# Patient Record
Sex: Female | Born: 1960 | Race: White | Hispanic: No | Marital: Married | Smoking: Never smoker
Health system: Southern US, Community
[De-identification: ages and names within clinical notes are randomized; demographics above are authoritative.]

## PROBLEM LIST (undated history)

## (undated) DIAGNOSIS — E785 Hyperlipidemia, unspecified: Secondary | ICD-10-CM

## (undated) DIAGNOSIS — I10 Essential (primary) hypertension: Secondary | ICD-10-CM

## (undated) DIAGNOSIS — R030 Elevated blood-pressure reading, without diagnosis of hypertension: Secondary | ICD-10-CM

## (undated) DIAGNOSIS — Z8679 Personal history of other diseases of the circulatory system: Secondary | ICD-10-CM

## (undated) DIAGNOSIS — E0590 Thyrotoxicosis, unspecified without thyrotoxic crisis or storm: Secondary | ICD-10-CM

## (undated) HISTORY — DX: Personal history of other diseases of the circulatory system: Z86.79

## (undated) HISTORY — DX: Essential (primary) hypertension: I10

## (undated) HISTORY — DX: Elevated blood-pressure reading, without diagnosis of hypertension: R03.0

## (undated) HISTORY — PX: APPENDECTOMY: SHX54

## (undated) HISTORY — DX: Thyrotoxicosis, unspecified without thyrotoxic crisis or storm: E05.90

## (undated) HISTORY — DX: Hyperlipidemia, unspecified: E78.5

---

## 1997-09-22 ENCOUNTER — Other Ambulatory Visit: Admission: RE | Admit: 1997-09-22 | Discharge: 1997-09-22 | Payer: Self-pay | Admitting: Gastroenterology

## 1998-04-18 ENCOUNTER — Other Ambulatory Visit: Admission: RE | Admit: 1998-04-18 | Discharge: 1998-04-18 | Payer: Self-pay | Admitting: Obstetrics and Gynecology

## 1999-06-19 ENCOUNTER — Other Ambulatory Visit: Admission: RE | Admit: 1999-06-19 | Discharge: 1999-06-19 | Payer: Self-pay | Admitting: Obstetrics and Gynecology

## 2000-03-12 ENCOUNTER — Encounter: Admission: RE | Admit: 2000-03-12 | Discharge: 2000-03-12 | Payer: Self-pay | Admitting: Obstetrics and Gynecology

## 2000-03-12 ENCOUNTER — Encounter: Payer: Self-pay | Admitting: Obstetrics and Gynecology

## 2000-03-22 ENCOUNTER — Encounter: Payer: Self-pay | Admitting: Neurological Surgery

## 2000-03-22 ENCOUNTER — Ambulatory Visit (HOSPITAL_COMMUNITY): Admission: RE | Admit: 2000-03-22 | Discharge: 2000-03-22 | Payer: Self-pay | Admitting: Neurological Surgery

## 2000-08-18 ENCOUNTER — Other Ambulatory Visit: Admission: RE | Admit: 2000-08-18 | Discharge: 2000-08-18 | Payer: Self-pay | Admitting: Obstetrics and Gynecology

## 2000-08-29 ENCOUNTER — Encounter: Admission: RE | Admit: 2000-08-29 | Discharge: 2000-11-27 | Payer: Self-pay | Admitting: Neurological Surgery

## 2000-10-03 ENCOUNTER — Ambulatory Visit (HOSPITAL_COMMUNITY): Admission: RE | Admit: 2000-10-03 | Discharge: 2000-10-03 | Payer: Self-pay | Admitting: Neurological Surgery

## 2000-10-03 ENCOUNTER — Encounter: Payer: Self-pay | Admitting: Neurological Surgery

## 2001-03-17 ENCOUNTER — Encounter: Payer: Self-pay | Admitting: Obstetrics and Gynecology

## 2001-03-17 ENCOUNTER — Encounter: Admission: RE | Admit: 2001-03-17 | Discharge: 2001-03-17 | Payer: Self-pay | Admitting: Obstetrics and Gynecology

## 2001-09-21 ENCOUNTER — Other Ambulatory Visit: Admission: RE | Admit: 2001-09-21 | Discharge: 2001-09-21 | Payer: Self-pay | Admitting: Obstetrics and Gynecology

## 2004-01-25 ENCOUNTER — Other Ambulatory Visit: Admission: RE | Admit: 2004-01-25 | Discharge: 2004-01-25 | Payer: Self-pay | Admitting: Obstetrics and Gynecology

## 2005-02-27 ENCOUNTER — Other Ambulatory Visit: Admission: RE | Admit: 2005-02-27 | Discharge: 2005-02-27 | Payer: Self-pay | Admitting: Obstetrics and Gynecology

## 2009-05-23 ENCOUNTER — Encounter: Admission: RE | Admit: 2009-05-23 | Discharge: 2009-05-23 | Payer: Self-pay | Admitting: Obstetrics and Gynecology

## 2009-06-22 DIAGNOSIS — R030 Elevated blood-pressure reading, without diagnosis of hypertension: Secondary | ICD-10-CM

## 2009-06-22 DIAGNOSIS — E785 Hyperlipidemia, unspecified: Secondary | ICD-10-CM | POA: Insufficient documentation

## 2009-06-27 ENCOUNTER — Ambulatory Visit: Payer: Self-pay | Admitting: Cardiology

## 2009-06-27 DIAGNOSIS — I10 Essential (primary) hypertension: Secondary | ICD-10-CM | POA: Insufficient documentation

## 2009-06-27 DIAGNOSIS — Z8679 Personal history of other diseases of the circulatory system: Secondary | ICD-10-CM | POA: Insufficient documentation

## 2009-08-04 ENCOUNTER — Ambulatory Visit (HOSPITAL_COMMUNITY): Admission: RE | Admit: 2009-08-04 | Discharge: 2009-08-04 | Payer: Self-pay | Admitting: Cardiology

## 2009-08-04 ENCOUNTER — Ambulatory Visit: Payer: Self-pay

## 2009-08-04 ENCOUNTER — Ambulatory Visit: Payer: Self-pay | Admitting: Cardiology

## 2009-08-04 ENCOUNTER — Encounter: Payer: Self-pay | Admitting: Cardiology

## 2009-08-04 ENCOUNTER — Ambulatory Visit: Payer: Self-pay | Admitting: Cardiovascular Disease

## 2009-08-08 ENCOUNTER — Telehealth: Payer: Self-pay | Admitting: Cardiology

## 2009-08-08 LAB — CONVERTED CEMR LAB
ALT: 21 units/L (ref 0–35)
AST: 26 units/L (ref 0–37)
Albumin: 4.8 g/dL (ref 3.5–5.2)
Alkaline Phosphatase: 40 units/L (ref 39–117)
BUN: 9 mg/dL (ref 6–23)
Bilirubin, Direct: 0 mg/dL (ref 0.0–0.3)
CO2: 23 meq/L (ref 19–32)
Calcium: 9.8 mg/dL (ref 8.4–10.5)
Chloride: 107 meq/L (ref 96–112)
Creatinine, Ser: 0.8 mg/dL (ref 0.4–1.2)
GFR calc non Af Amer: 80.71 mL/min (ref >60)
Glucose, Bld: 87 mg/dL (ref 70–99)
Potassium: 4.1 meq/L (ref 3.5–5.1)
Sodium: 141 meq/L (ref 135–145)
Total Bilirubin: 0.4 mg/dL (ref 0.3–1.2)
Total Protein: 7.6 g/dL (ref 6.0–8.3)

## 2009-08-09 ENCOUNTER — Ambulatory Visit: Payer: Self-pay | Admitting: Cardiology

## 2010-05-27 ENCOUNTER — Encounter: Payer: Self-pay | Admitting: Obstetrics and Gynecology

## 2010-06-07 NOTE — Assessment & Plan Note (Signed)
Summary: 1 month rov   Visit Type:  1 mo f/u Referring Provider:  Richardean Chimera Primary Provider:  No PCP  CC:  no cardiac complaints today..pt is follwing a protein regimen that Dr. Arelia Sneddon has recommended in turn pt has lost 7 lb since 1 month.  History of Present Illness: Amanda Petty her times a day for hypertension and hyperlipidemia.  Her blood pressures are running consistently around 135/90 or less.  She is asymptomatic.  Echocardiogram showed no LVH which is reassuring for no uncontrolled hypertension for any degree of time. The rest of her echo is normal as well. I have reviewed this with her today.  She is extremely health-conscious and continues to exercise and watches her salt carefully. 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  Current Medications (verified): 1)  Venlafaxine Hcl 75 Mg Tabs (Venlafaxine Hcl) .... 1/2 Tab Once Daily 2)  Lisinopril-Hydrochlorothiazide 20-12.5 Mg Tabs (Lisinopril-Hydrochlorothiazide) .Marland Kitchen.. 1 Once Daily 3)  Calcium 500 Mg Tabs (Calcium) .Marland Kitchen.. 1 Tab Two Times A Day 4)  Multivitamins   Tabs (Multiple Vitamin) .Marland Kitchen.. 1 Tab Once Daily 5)  Ferrous Sulfate 325 (65 Fe) Mg Tabs (Ferrous Sulfate) .Marland Kitchen.. 1 Tab Two Times A Day  Allergies (verified): No Known Drug Allergies  Past History:  Past Surgical History: Last updated: 06/27/2009 Appendectomy  Review of Systems       negative other than history of present illness  Vital Signs:  Patient profile:   50 year old female Height:      62 inches Weight:      111 pounds BMI:     20.38 Pulse rate:   88 / minute Pulse rhythm:   regular BP sitting:   134 / 80  (left arm) Cuff size:   large  Vitals Entered By: Danielle Rankin, CMA (August 09, 2009 10:30 AM)  Physical Exam  General:  Well developed, well nourished, in no acute distress. Head:  normocephalic and atraumatic Eyes:  PERRLA/EOM intact; conjunctiva and lids normal. Neck:  Neck supple, no JVD. No masses, thyromegaly or abnormal cervical nodes. Chest Amanda Petty:   no deformities or breast masses noted Lungs:  Clear bilaterally to auscultation and percussion. Heart:  Non-displaced PMI, chest non-tender; regular rate and rhythm, S1, S2 without murmurs, rubs or gallops. Carotid upstroke normal, no bruit. Normal abdominal aortic size, no bruits. Femorals normal pulses, no bruits. Pedals normal pulses. No edema, no varicosities. Msk:  Back normal, normal gait. Muscle strength and tone normal. Pulses:  pulses normal in all 4 extremities Extremities:  No clubbing or cyanosis. Neurologic:  Alert and oriented x 3. Skin:  Intact without lesions or rashes. Psych:  Normal affect.   Impression & Recommendations:  Problem # 1:  HYPERTENSION, UNSPECIFIED (ICD-401.9) Assessment Improved I have reviewed her outside blood pressure log. She is currently averaging around 135 over about 90. In the office today her diastolic is good. We'll continue to monitor her resting blood pressures at home several times a week. Her goal is 130/80 and certainly below 140/85. She will call us if this is not consistently her read.  Her updated medication list for this problem includes:    Lisinopril-hydrochlorothiazide 20-12.5 Mg Tabs (Lisinopril-hydrochlorothiazide) .Marland Kitchen... 1 once daily  Problem # 2:  HYPERLIPIDEMIA (ICD-272.4) Assessment: Unchanged Once we have her liposcience profile, I will call her. Her electrolytes as well as her liver panel are normal and reviewed with her today.  Patient Instructions: 1)  Your physician recommends that you schedule a follow-up appointment in: 3  MONTHS WITH DR Little Winton 2)  Your physician recommends that you continue on your current medications as directed. Please refer to the Current Medication list given to you today. 3)  Your physician has requested that you regularly monitor and record your blood pressure readings at home.  Please use the same machine at the same time of day to check your readings and record them to bring to your follow-up  visit.CHECK B/P 3 X WEEKLY GOAL B/P IS 130/80 IF REPEATEDLY 140/85  OR HIGHER CALL OFFICE

## 2010-06-07 NOTE — Miscellaneous (Signed)
  Clinical Lists Changes  Orders: Added new Service order of EKG w/ Interpretation (93000) - Signed Added new Referral order of Echocardiogram (Echo) - Signed Observations: Added new observation of PI CARDIO: Your physician recommends that you schedule a follow-up appointment in: 4 WEEKS WITH DR Moorea Boissonneault Your physician recommends that you return for lab work ZO:XWRU DAY AS  ECHO BMET LIPOSCIENCE LIVER FASTING Your physician has requested that you have an echocardiogram.  Echocardiography is a painless test that uses sound waves to create images of your heart. It provides your doctor with information about the size and shape of your heart and how well your heart's chambers and valves are working.  This procedure takes approximately one hour. There are no restrictions for this procedure.LAB SAME DAY  SEE ABOVE Your physician has recommended you make the following change in your medication: LISINOPRIL/HCTZ 20/12.5 MG 1 EVERY AM (06/27/2009 16:15)      Patient Instructions: 1)  Your physician recommends that you schedule a follow-up appointment in: 4 WEEKS WITH DR Akira Perusse 2)  Your physician recommends that you return for lab work EA:VWUJ DAY AS  ECHO BMET LIPOSCIENCE LIVER FASTING 3)  Your physician has requested that you have an echocardiogram.  Echocardiography is a painless test that uses sound waves to create images of your heart. It provides your doctor with information about the size and shape of your heart and how well your heart's chambers and valves are working.  This procedure takes approximately one hour. There are no restrictions for this procedure.LAB SAME DAY  SEE ABOVE 4)  Your physician has recommended you make the following change in your medication: LISINOPRIL/HCTZ 20/12.5 MG 1 EVERY AM

## 2010-06-07 NOTE — Progress Notes (Signed)
Summary: PT RETURN CALL  Phone Note Call from Patient Call back at [phone]   Summary of Call: PT ERTURNING CALL Initial call taken by: Judie Grieve,  August 08, 2009 2:29 PM  Follow-up for Phone Call        Phone Call CompletedPT AWARE OF LAB RESULTS. Follow-up by: Scherrie Bateman, LPN,  August 08, 2009 2:37 PM

## 2010-06-07 NOTE — Assessment & Plan Note (Signed)
Summary: np6/elevated lipids & Bp/jml   Visit Type:  new pt visit Referring Provider:  Richardean Chimera Primary Provider:  No PCP  CC:  pt referred for hyperlipidemia and HTN....denies any cardiac complaints.  History of Present Illness: Amanda Petty comes in today for evaluation and management of hyperlipidemia and hypertension.  Hyperlipidemia runs in her family. Hypertension does as well.  She's extremely health-conscious. She is not awake, exercises 4-5 times a week, and is very particular about her diet including fat and salt restriction.  She denies any chest discomfort or angina. He's had no headaches, visual disturbances, orthopnea, PND, peripheral edema, or rash. She denies any tachycardia or palpitations.  Recent blood work including a thyroid panel were normal. I do not see electrolytes.  Her blood sugars normal. There is no history of tobacco use or excess alcohol use. She does not use illicit drugs.  Her total cholesterol was 308, LDL is 150, triglycerides 83, but her HDL is 141. Her total cholesterol HDL ratio is 2.2  Preventive Screening-Counseling & Management  Alcohol-Tobacco     Smoking Status: never  Caffeine-Diet-Exercise     Does Patient Exercise: yes      Drug Use:  no.    Current Medications (verified): 1)  Venlafaxine Hcl 75 Mg Tabs (Venlafaxine Hcl) .... 1/2 Tab Once Daily  Allergies (verified): No Known Drug Allergies  Past History:  Family History: Last updated: July 13, 2009 Father: deceased at 73 due to heart failure Mother: alive and well Siblings: alive and well  Social History: Last updated: 2009-07-13 Married  Tobacco Use - No.  Alcohol Use - yes Regular Exercise - yes Drug Use - no  Risk Factors: Exercise: yes (13-Jul-2009)  Risk Factors: Smoking Status: never (2009/07/13)  Past Medical History: ELEVATED BLOOD PRESSURE (ICD-796.2) HYPERLIPIDEMIA (ICD-272.4) HEART MURMUR, HX OF (ICD-V12.50)  Past Surgical  History: Appendectomy  Family History: Father: deceased at 26 due to heart failure Mother: alive and well Siblings: alive and well  Social History: Married  Tobacco Use - No.  Alcohol Use - yes Regular Exercise - yes Drug Use - no Smoking Status:  never Does Patient Exercise:  yes Drug Use:  no  Review of Systems       negative other than history of present illness  Vital Signs:  Patient profile:   50 year old female Height:      62 inches Weight:      118 pounds BMI:     21.66 Pulse rate:   88 / minute Pulse rhythm:   irregular BP sitting:   146 / 80  (left arm) Cuff size:   large  Vitals Entered By: Danielle Rankin, CMA (13-Jul-2009 3:24 PM)  Physical Exam  General:  Well developed, well nourished, in no acute distress. Head:  normocephalic and atraumatic Eyes:  PERRLA/EOM intact; conjunctiva and lids normal. Mouth:  Teeth, gums and palate normal. Oral mucosa normal. Neck:  Neck supple, no JVD. No masses, thyromegaly or abnormal cervical nodes. Chest Amanda Petty:  no deformities or breast masses noted Lungs:  Clear bilaterally to auscultation and percussion. Heart:  Non-displaced PMI, chest non-tender; regular rate and rhythm, S1, S2 with soft systolic murmur along left sternal border. rubs or gallops. Carotid upstroke normal, no bruit. Normal abdominal aortic size, no bruits. Femorals normal pulses, no bruits. Pedals normal pulses. No edema, no varicosities. Abdomen:  Bowel sounds positive; abdomen soft and non-tender without masses, organomegaly, or hernias noted. No hepatosplenomegaly. Msk:  Back normal, normal gait. Muscle strength  and tone normal. Pulses:  pulses normal in all 4 extremities Extremities:  No clubbing or cyanosis. Neurologic:  Alert and oriented x 3. Skin:  Intact without lesions or rashes. Psych:  anxious.     Problems:  Medical Problems Added: 1)  Dx of Hypertension, Unspecified  (ICD-401.9) 2)  Dx of Heart Murmur, Hx of   (ICD-V12.50)  EKG  Procedure date:  06/27/2009  Findings:      normal sinus rhythm, nonspecific ST segment changes.  Impression & Recommendations:  Problem # 1:  HYPERLIPIDEMIA (ICD-272.4) Her total cholesterol is 308, LDL is 150, but her triglycerides are normal and her HDL is 141! This gives her total cholesterol to HDL ratio of 2.2. Thyroid function is normal as is her blood sugar. At the present time I would not treat this. When she returns for her echocardiogram, will obtain a fasting like a sinus profile to look at LDL particle number as well as fractionate her HDL.  Problem # 2:  HYPERTENSION, UNSPECIFIED (ICD-401.9) Assessment: New We will go ahead and begin medication today. She's had 3 more pressures have been high and she has no other associated risk factors. She is extremely disciplined with diet, Amanda Petty, and exercise. She is not overweight. Her thyroid function is normal. Orders: EKG w/ Interpretation (93000)  Her updated medication list for this problem includes:    Lisinopril-hydrochlorothiazide 20-12.5 Mg Tabs (Lisinopril-hydrochlorothiazide) .Marland Kitchen... 1 once daily  Problem # 3:  HEART MURMUR, HX OF (ICD-V12.50) Assessment: New This is most likely related to increased flow across the aortic valve with an elevated heart rate and blood pressure today. I will obtain an echocardiogram to rule out any structural heart disease. Orders: Echocardiogram (Echo)  Patient Instructions: 1)  Your physician recommends that you schedule a follow-up appointment in:  2)  Your physician has recommended you make the following change in your medication: LISINOPRIL 20/12.5MG  1 EVERY AM 3)  Your physician has requested that you have an echocardiogram.  Echocardiography is a painless test that uses sound waves to create images of your heart. It provides your doctor with information about the size and shape of your heart and how well your heart's chambers and valves are working.  This procedure  takes approximately one hour. There are no restrictions for this procedure. Prescriptions: LISINOPRIL-HYDROCHLOROTHIAZIDE 20-12.5 MG TABS (LISINOPRIL-HYDROCHLOROTHIAZIDE) 1 once daily  #30 x 11   Entered by:   Scherrie Bateman, LPN   Authorized by:   Gaylord Shih, MD, [facility]   Signed by:   Scherrie Bateman, LPN on 16/02/9603   Method used:   Electronically to        [facility]* (retail)       [address]       [place], [place]  [id number]       Ph: [phone]       Fax: [phone]   RxID:   [id number]

## 2010-07-17 ENCOUNTER — Encounter: Payer: Self-pay | Admitting: Cardiology

## 2010-11-06 ENCOUNTER — Encounter: Payer: Self-pay | Admitting: Cardiology

## 2011-08-13 ENCOUNTER — Other Ambulatory Visit: Payer: Self-pay | Admitting: *Deleted

## 2011-08-13 MED ORDER — LISINOPRIL-HYDROCHLOROTHIAZIDE 20-12.5 MG PO TABS: 1.0000 | ORAL_TABLET | Freq: Every day | ORAL | Status: DC

## 2011-08-19 ENCOUNTER — Telehealth: Payer: Self-pay | Admitting: Cardiology

## 2011-08-19 MED ORDER — LISINOPRIL-HYDROCHLOROTHIAZIDE 20-12.5 MG PO TABS: 1.0000 | ORAL_TABLET | Freq: Every day | ORAL | Status: DC

## 2011-08-19 NOTE — Telephone Encounter (Signed)
New msg Pt wants to talk to you about med lisinopril and having another appt with Dr wall

## 2011-09-04 ENCOUNTER — Encounter: Payer: Self-pay | Admitting: *Deleted

## 2011-09-13 ENCOUNTER — Ambulatory Visit: Payer: Self-pay | Admitting: Cardiology

## 2015-10-20 ENCOUNTER — Other Ambulatory Visit: Payer: Self-pay | Admitting: Obstetrics and Gynecology

## 2015-10-20 DIAGNOSIS — E041 Nontoxic single thyroid nodule: Secondary | ICD-10-CM

## 2015-10-23 ENCOUNTER — Ambulatory Visit
Admission: RE | Admit: 2015-10-23 | Discharge: 2015-10-23 | Disposition: A | Discharge status: Home / Self Care | Payer: No Typology Code available for payment source | Source: Ambulatory Visit | Attending: Obstetrics and Gynecology | Admitting: Obstetrics and Gynecology

## 2015-10-23 DIAGNOSIS — E041 Nontoxic single thyroid nodule: Secondary | ICD-10-CM

## 2015-11-17 ENCOUNTER — Encounter: Payer: Self-pay | Admitting: Endocrinology

## 2015-11-17 ENCOUNTER — Ambulatory Visit (INDEPENDENT_AMBULATORY_CARE_PROVIDER_SITE_OTHER): Payer: No Typology Code available for payment source | Admitting: Endocrinology

## 2015-11-17 VITALS — BP 106/66 | HR 96 | Temp 98.9°F | Ht 60.75 in | Wt 110.0 lb

## 2015-11-17 DIAGNOSIS — E0590 Thyrotoxicosis, unspecified without thyrotoxic crisis or storm: Secondary | ICD-10-CM

## 2015-11-17 NOTE — Progress Notes (Signed)
Subjective:    Patient ID: Amanda Petty, female    DOB: 1960-09-21, 55 y.o.   MRN: [id number]  HPI Pt reports he was dx'ed with hyperthyroidism 1 month ago, when she presented with slight swelling at the anterior neck, but no assoc pain.  she has never been on therapy for this.  she has never had XRT to the anterior neck, or thyroid surgery.  she does not consume kelp or any other prescribed or non-prescribed thyroid medication.  she has never been on amiodarone.   Past Medical History  Diagnosis Date  . Elevated blood pressure reading without diagnosis of hypertension   . Other and unspecified hyperlipidemia   . Personal history of unspecified circulatory disease     hx  . Unspecified essential hypertension     Past Surgical History  Procedure Laterality Date  . Appendectomy      Social History   Social History  . Marital Status: Married    Spouse Name: N/A  . Number of Children: N/A  . Years of Education: N/A   Occupational History  . Not on file.   Social History Main Topics  . Smoking status: Never Smoker   . Smokeless tobacco: Not on file  . Alcohol Use: Yes  . Drug Use: No  . Sexual Activity: Not on file   Other Topics Concern  . Not on file   Social History Narrative   Regularly exercies.     Current Outpatient Prescriptions on File Prior to Visit  Medication Sig Dispense Refill  . calcium carbonate (CALCIUM 500) 1250 MG tablet Take 1 tablet by mouth 2 (two) times daily.      . ferrous sulfate 325 (65 FE) MG tablet Take 325 mg by mouth 2 (two) times daily.      . Multiple Vitamins-Minerals (MULTIVITAL) tablet Take 1 tablet by mouth daily.       No current facility-administered medications on file prior to visit.    Not on File  Family History  Problem Relation Age of Onset  . Heart failure Father 61  . Thyroid disease Neg Hx     BP 106/66 mmHg  Pulse 96  Temp(Src) 98.9 F (37.2 C) (Oral)  Ht 5' 0.75" (1.543 m)  Wt 110 lb (49.896 kg)   BMI 20.96 kg/m2  Review of Systems denies weight loss, headache, hoarseness, diplopia, sob, diarrhea, polyuria, muscle weakness, excessive diaphoresis, tremor, anxiety, heat intolerance, and rhinorrhea.  She has slight palpitations and easy bruising.     Objective:   Physical Exam VS: see vs page GEN: no distress HEAD: head: no deformity eyes: no periorbital swelling, no proptosis external nose and ears are normal mouth: no lesion seen NECK: thyroid is slightly enlarged, and slightly firm.  No palpable nodue CHEST WALL: no deformity LUNGS: clear to auscultation CV: reg rate and rhythm; soft systolic murmur ABD: abdomen is soft, nontender.  no hepatosplenomegaly.  not distended.  no hernia MUSCULOSKELETAL: muscle bulk and strength are grossly normal.  no obvious joint swelling.  gait is normal and steady EXTEMITIES: no deformity.  no ulcer on the feet.  feet are of normal color and temp.  no edema PULSES: dorsalis pedis intact bilat.  no carotid bruit NEURO:  cn 2-12 grossly intact.   readily moves all 4's.  sensation is intact to touch on the feet.  No tremor SKIN:  Normal texture and temperature.  No rash or suspicious lesion is visible.   NODES:  None palpable at the  neck PSYCH: alert, well-oriented.  Does not appear anxious nor depressed.  outside test results are reviewed: TSH=0.01  US: Heterogeneous thyroid parenchyma without significant thyroid enlargement or focal nodules.  I have reviewed outside records, and summarized: Pt was noted to have a slightly enlarged thyroid, and US was ordered. Pt was then noted to have suppressed TSH, and referred here.    Assessment & Plan:  Hyperthyroidism, new, prob due to Grave's Dz.   Patient is advised the following: Patient Instructions  Please consider the options we discussed, and let me know.      Hyperthyroidism Hyperthyroidism is when the thyroid is too active (overactive). Your thyroid is a large gland that is located in  your neck. The thyroid helps to control how your body uses food (metabolism). When your thyroid is overactive, it produces too much of a hormone called thyroxine.  CAUSES Causes of hyperthyroidism may include:  Graves disease. This is when your immune system attacks the thyroid gland. This is the most common cause.  Inflammation of the thyroid gland.  Tumor in the thyroid gland or somewhere else.  Excessive use of thyroid medicines, including:  Prescription thyroid supplement.  Herbal supplements that mimic thyroid hormones.  Solid or fluid-filled lumps within your thyroid gland (thyroid nodules).  Excessive ingestion of iodine. RISK FACTORS  Being female.  Having a family history of thyroid conditions. SIGNS AND SYMPTOMS Signs and symptoms of hyperthyroidism may include:  Nervousness.  Inability to tolerate heat.  Unexplained weight loss.  Diarrhea.  Change in the texture of hair or skin.  Heart skipping beats or making extra beats.  Rapid heart rate.  Loss of menstruation.  Shaky hands.  Fatigue.  Restlessness.  Increased appetite.  Sleep problems.  Enlarged thyroid gland or nodules. DIAGNOSIS  Diagnosis of hyperthyroidism may include:  Medical history and physical exam.  Blood tests.  Ultrasound tests. TREATMENT Treatment may include:  Medicines to control your thyroid.  Surgery to remove your thyroid.  Radiation therapy. HOME CARE INSTRUCTIONS   Take medicines only as directed by your health care provider.  Do not use any tobacco products, including cigarettes, chewing tobacco, or electronic cigarettes. If you need help quitting, ask your health care provider.  Do not exercise or do physical activity until your health care provider approves.  Keep all follow-up appointments as directed by your health care provider. This is important. SEEK MEDICAL CARE IF:  Your symptoms do not get better with treatment.  You have fever.  You are  taking thyroid replacement medicine and you:  Have depression.  Feel mentally and physically slow.  Have weight gain. SEEK IMMEDIATE MEDICAL CARE IF:   You have decreased alertness or a change in your awareness.  You have abdominal pain.  You feel dizzy.  You have a rapid heartbeat.  You have an irregular heartbeat.   This information is not intended to replace advice given to you by your health care provider. Make sure you discuss any questions you have with your health care provider.   Document Released: 04/22/2005 Document Revised: 05/13/2014 Document Reviewed: 09/07/2013 Elsevier Interactive Patient Education 2016 Elsevier Inc.   Romero BellingELLISON, Adraine Biffle, MD

## 2015-11-17 NOTE — Progress Notes (Signed)
Pre visit review using our clinic review tool, if applicable. No additional management support is needed unless otherwise documented below in the visit note. 

## 2015-11-17 NOTE — Patient Instructions (Signed)
Please consider the options we discussed, and let me know.      Hyperthyroidism Hyperthyroidism is when the thyroid is too active (overactive). Your thyroid is a large gland that is located in your neck. The thyroid helps to control how your body uses food (metabolism). When your thyroid is overactive, it produces too much of a hormone called thyroxine.  CAUSES Causes of hyperthyroidism may include:  Graves disease. This is when your immune system attacks the thyroid gland. This is the most common cause.  Inflammation of the thyroid gland.  Tumor in the thyroid gland or somewhere else.  Excessive use of thyroid medicines, including:  Prescription thyroid supplement.  Herbal supplements that mimic thyroid hormones.  Solid or fluid-filled lumps within your thyroid gland (thyroid nodules).  Excessive ingestion of iodine. RISK FACTORS  Being female.  Having a family history of thyroid conditions. SIGNS AND SYMPTOMS Signs and symptoms of hyperthyroidism may include:  Nervousness.  Inability to tolerate heat.  Unexplained weight loss.  Diarrhea.  Change in the texture of hair or skin.  Heart skipping beats or making extra beats.  Rapid heart rate.  Loss of menstruation.  Shaky hands.  Fatigue.  Restlessness.  Increased appetite.  Sleep problems.  Enlarged thyroid gland or nodules. DIAGNOSIS  Diagnosis of hyperthyroidism may include:  Medical history and physical exam.  Blood tests.  Ultrasound tests. TREATMENT Treatment may include:  Medicines to control your thyroid.  Surgery to remove your thyroid.  Radiation therapy. HOME CARE INSTRUCTIONS   Take medicines only as directed by your health care provider.  Do not use any tobacco products, including cigarettes, chewing tobacco, or electronic cigarettes. If you need help quitting, ask your health care provider.  Do not exercise or do physical activity until your health care provider  approves.  Keep all follow-up appointments as directed by your health care provider. This is important. SEEK MEDICAL CARE IF:  Your symptoms do not get better with treatment.  You have fever.  You are taking thyroid replacement medicine and you:  Have depression.  Feel mentally and physically slow.  Have weight gain. SEEK IMMEDIATE MEDICAL CARE IF:   You have decreased alertness or a change in your awareness.  You have abdominal pain.  You feel dizzy.  You have a rapid heartbeat.  You have an irregular heartbeat.   This information is not intended to replace advice given to you by your health care provider. Make sure you discuss any questions you have with your health care provider.   Document Released: 04/22/2005 Document Revised: 05/13/2014 Document Reviewed: 09/07/2013 Elsevier Interactive Patient Education [employer]2016 Elsevier Inc.

## 2015-11-18 ENCOUNTER — Encounter: Payer: Self-pay | Admitting: Endocrinology

## 2015-11-18 DIAGNOSIS — E0590 Thyrotoxicosis, unspecified without thyrotoxic crisis or storm: Secondary | ICD-10-CM | POA: Insufficient documentation

## 2019-05-18 ENCOUNTER — Ambulatory Visit: Payer: No Typology Code available for payment source | Attending: Internal Medicine

## 2019-05-18 DIAGNOSIS — Z23 Encounter for immunization: Secondary | ICD-10-CM | POA: Insufficient documentation

## 2019-05-18 NOTE — Progress Notes (Signed)
   Covid-19 Vaccination Clinic  Name:  Amanda Petty    MRN: [id number] DOB: 01-22-1961  05/18/2019  [name] was observed post Covid-19 immunization for 15 minutes without incidence. She was provided with Vaccine Information Sheet and instruction to access the V-Safe system.   [name] was instructed to call 911 with any severe reactions post vaccine: Marland Kitchen Difficulty breathing  . Swelling of your face and throat  . A fast heartbeat  . A bad rash all over your body  . Dizziness and weakness

## 2019-06-05 ENCOUNTER — Ambulatory Visit: Payer: No Typology Code available for payment source | Attending: Internal Medicine

## 2019-06-05 DIAGNOSIS — Z23 Encounter for immunization: Secondary | ICD-10-CM | POA: Insufficient documentation

## 2019-06-05 NOTE — Progress Notes (Signed)
   Covid-19 Vaccination Clinic  Name:  Amanda Petty    MRN: [id number] DOB: February 10, 1961  06/05/2019  [name] was observed post Covid-19 immunization for 15 minutes without incidence. She was provided with Vaccine Information Sheet and instruction to access the V-Safe system.   [name] was instructed to call 911 with any severe reactions post vaccine: Marland Kitchen Difficulty breathing  . Swelling of your face and throat  . A fast heartbeat  . A bad rash all over your body  . Dizziness and weakness    Immunizations Administered    Name Date Dose VIS Date Route   Pfizer COVID-19 Vaccine 06/05/2019 12:21 PM 0.3 mL 04/16/2019 Intramuscular   Manufacturer: [employer], [employer]   Lot: [id number]   NDC: [address]-7

## 2021-03-20 ENCOUNTER — Ambulatory Visit (INDEPENDENT_AMBULATORY_CARE_PROVIDER_SITE_OTHER): Payer: No Typology Code available for payment source | Admitting: Cardiovascular Disease

## 2021-03-20 ENCOUNTER — Encounter: Payer: Self-pay | Admitting: Cardiovascular Disease

## 2021-03-20 ENCOUNTER — Other Ambulatory Visit: Payer: Self-pay

## 2021-03-20 VITALS — BP 126/60 | HR 66 | Ht 61.0 in | Wt 126.0 lb

## 2021-03-20 DIAGNOSIS — E785 Hyperlipidemia, unspecified: Secondary | ICD-10-CM | POA: Diagnosis not present

## 2021-03-20 DIAGNOSIS — I10 Essential (primary) hypertension: Secondary | ICD-10-CM

## 2021-03-20 MED ORDER — ATORVASTATIN CALCIUM 40 MG PO TABS: 40.0000 mg | ORAL_TABLET | Freq: Every day | ORAL | 3 refills | Status: DC

## 2021-03-20 NOTE — Assessment & Plan Note (Signed)
History of mitral valve prolapse with a late systolic click on exam.  I am going to check a 2D echo to further evaluate.

## 2021-03-20 NOTE — Progress Notes (Signed)
03/20/2021 Amanda Petty   Apr 27, 1961  [id number]  Primary Physician Patient, No Pcp Per (Inactive) Primary Cardiologist: Amanda Harp MD Amanda Petty, [place]  HPI:  Amanda Petty is a 60 y.o. fit appearing married Caucasian female mother of 44, grandmother of 2 grandchildren referred by Amanda Petty , her PCP/OB/GYN for evaluation of hyperlipidemia.  She did see Dr. Verl Petty remotely.  She has no cardiac risk factors other than untreated hyperlipidemia.  There is no family history of heart disease.  She is never had a heart attack or stroke.  She denies chest pain or shortness of breath.  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  She does have a history of mitral valve prolapse.  She is currently on pravastatin 40 mg a day with lipid profile performed 12/16/2019 revealing total cholesterol 274, LDL 148 and HDL 114.   Current Meds  Medication Sig   calcium carbonate (OS-CAL - DOSED IN MG OF ELEMENTAL CALCIUM) 1250 (500 Ca) MG tablet Take 1 tablet by mouth 2 (two) times daily.     ferrous sulfate 325 (65 FE) MG tablet Take 325 mg by mouth 2 (two) times daily.     ibandronate (BONIVA) 150 MG tablet Take 150 mg by mouth. Once a month   methimazole (TAPAZOLE) 5 MG tablet Take 5 mg by mouth.   Multiple Vitamins-Minerals (MULTIVITAL) tablet Take 1 tablet by mouth daily.     PARoxetine (PAXIL) 20 MG tablet Take 20 mg by mouth daily.   pravastatin (PRAVACHOL) 40 MG tablet Take 40 mg by mouth daily.   zolpidem (AMBIEN) 10 MG tablet Take 10 mg by mouth as needed.     Not on File  Social History   Socioeconomic History   Marital status: Married    Spouse name: Not on file   Number of children: Not on file   Years of education: Not on file   Highest education level: Not on file  Occupational History   Not on file  Tobacco Use   Smoking status: Never   Smokeless tobacco: Never  Substance and Sexual Activity   Alcohol use: Yes   Drug  use: No   Sexual activity: Not on file  Other Topics Concern   Not on file  Social History Narrative   Regularly exercies.    Social Determinants of Health   Financial Resource Strain: Not on file  Food Insecurity: Not on file  Transportation Needs: Not on file  Physical Activity: Not on file  Stress: Not on file  Social Connections: Not on file  Intimate Partner Violence: Not on file     Review of Systems: General: negative for chills, fever, night sweats or weight changes.  Cardiovascular: negative for chest pain, dyspnea on exertion, edema, orthopnea, palpitations, paroxysmal nocturnal dyspnea or shortness of breath Dermatological: negative for rash Respiratory: negative for cough or wheezing Urologic: negative for hematuria Abdominal: negative for nausea, vomiting, diarrhea, bright red blood per rectum, melena, or hematemesis Neurologic: negative for visual changes, syncope, or dizziness All other systems reviewed and are otherwise negative except as noted above.    Blood pressure 126/60, pulse 66, height 5\' 1"  (1.549 m), weight 126 lb (57.2 kg), SpO2 97 %.  General appearance: alert and no distress Neck: no adenopathy, no carotid bruit, no JVD, supple, symmetrical, trachea midline, and thyroid not enlarged, symmetric, no tenderness/mass/nodules Lungs: clear to auscultation bilaterally Heart: regular rate and rhythm, S1,  S2 normal, no murmur, click, rub or gallop Extremities: extremities normal, atraumatic, no cyanosis or edema Pulses: 2+ and symmetric Skin: Skin color, texture, turgor normal. No rashes or lesions Neurologic: Grossly normal  EKG sinus rhythm at 66 with left bundle branch block.  I personally reviewed this EKG.  ASSESSMENT AND PLAN:   HYPERLIPIDEMIA History of hyperlipidemia currently on pravastatin 40 mg a day started by her OB/GYN.  Her last lipid profile performed 12/16/2019 revealed total cholesterol 274, LDL of 148 and HDL of 114.  And go to get a  coronary calcium score to further evaluate.  I am changing her pravastatin to atorvastatin 40 mg a day.  History of cardiovascular disorder History of mitral valve prolapse with a late systolic click on exam.  I am going to check a 2D echo to further evaluate.     Amanda Gess MD FACP,FACC,FAHA, [facility] 03/20/2021 4:25 PM

## 2021-03-20 NOTE — Assessment & Plan Note (Signed)
History of hyperlipidemia currently on pravastatin 40 mg a day started by her OB/GYN.  Her last lipid profile performed 12/16/2019 revealed total cholesterol 274, LDL of 148 and HDL of 114.  And go to get a coronary calcium score to further evaluate.  I am changing her pravastatin to atorvastatin 40 mg a day.

## 2021-03-20 NOTE — Patient Instructions (Signed)
Medication Instructions:   -Stop pravastatin (pravachol).  -Start taking atorvastatin (lipitor) 40mg  once daily.  *If you need a refill on your cardiac medications before your next appointment, please call your pharmacy*   Lab Work: Your physician recommends that you return for lab work in: 3 months for FASTING lipid/liver profile.  If you have labs (blood work) drawn today and your tests are completely normal, you will receive your results only by: MyChart Message (if you have MyChart) OR A paper copy in the mail If you have any lab test that is abnormal or we need to change your treatment, we will call you to review the results.   Testing/Procedures: Your physician has requested that you have an echocardiogram. Echocardiography is a painless test that uses sound waves to create images of your heart. It provides your doctor with information about the size and shape of your heart and how well your heart's chambers and valves are working. This procedure takes approximately one hour. There are no restrictions for this procedure. This procedure is done at [address]  Dr. has ordered a CT coronary calcium score. This test is done at [address]. This is $99 out of pocket.   Coronary CalciumScan A coronary calcium scan is an imaging test used to look for deposits of calcium and other fatty materials (plaques) in the inner lining of the blood vessels of the heart (coronary arteries). These deposits of calcium and plaques can partly clog and narrow the coronary arteries without producing any symptoms or warning signs. This puts a person at risk for a heart attack. This test can detect these deposits before symptoms develop. Tell a health care provider about: Any allergies you have. All medicines you are taking, including vitamins, herbs, eye drops, creams, and over-the-counter medicines. Any problems you or family members have had with anesthetic  medicines. Any blood disorders you have. Any surgeries you have had. Any medical conditions you have. Whether you are pregnant or may be pregnant. What are the risks? Generally, this is a safe procedure. However, problems may occur, including: Harm to a pregnant woman and her unborn baby. This test involves the use of radiation. Radiation exposure can be dangerous to a pregnant woman and her unborn baby. If you are pregnant, you generally should not have this procedure done. Slight increase in the risk of cancer. This is because of the radiation involved in the test. What happens before the procedure? No preparation is needed for this procedure. What happens during the procedure? You will undress and remove any jewelry around your neck or chest. You will put on a hospital gown. Sticky electrodes will be placed on your chest. The electrodes will be connected to an electrocardiogram (ECG) machine to record a tracing of the electrical activity of your heart. A CT scanner will take pictures of your heart. During this time, you will be asked to lie still and hold your breath for 2-3 seconds while a picture of your heart is being taken. The procedure may vary among health care providers and hospitals. What happens after the procedure? You can get dressed. You can return to your normal activities. It is up to you to get the results of your test. Ask your health care provider, or the department that is doing the test, when your results will be ready. Summary A coronary calcium scan is an imaging test used to look for deposits of calcium and other fatty materials (plaques) in  the inner lining of the blood vessels of the heart (coronary arteries). Generally, this is a safe procedure. Tell your health care provider if you are pregnant or may be pregnant. No preparation is needed for this procedure. A CT scanner will take pictures of your heart. You can return to your normal activities after the scan  is done. This information is not intended to replace advice given to you by your health care provider. Make sure you discuss any questions you have with your health care provider. Document Released: 10/19/2007 Document Revised: 03/11/2016 Document Reviewed: 03/11/2016 Elsevier Interactive Patient Education  2017 [employer].    Follow-Up: At [facility], you and your health needs are our priority.  As part of our continuing mission to provide you with exceptional heart care, we have created designated Provider Care Teams.  These Care Teams include your primary Cardiologist (physician) and Advanced Practice Providers (APPs -  Physician Assistants and Nurse Practitioners) who all work together to provide you with the care you need, when you need it.  We recommend signing up for the patient portal called "MyChart".  Sign up information is provided on this After Visit Summary.  MyChart is used to connect with patients for Virtual Visits (Telemedicine).  Patients are able to view lab/test results, encounter notes, upcoming appointments, etc.  Non-urgent messages can be sent to your provider as well.   To learn more about what you can do with MyChart, go to [email or web address].    Your next appointment:   3 month(s)  The format for your next appointment:   In Person  Provider:   Nanetta Batty, MD

## 2021-04-16 ENCOUNTER — Ambulatory Visit (INDEPENDENT_AMBULATORY_CARE_PROVIDER_SITE_OTHER)
Admission: RE | Admit: 2021-04-16 | Discharge: 2021-04-16 | Disposition: A | Discharge status: Home / Self Care | Payer: Self-pay | Source: Ambulatory Visit | Attending: Cardiovascular Disease | Admitting: Cardiovascular Disease

## 2021-04-16 ENCOUNTER — Ambulatory Visit (HOSPITAL_COMMUNITY): Payer: No Typology Code available for payment source | Attending: Cardiovascular Disease

## 2021-04-16 ENCOUNTER — Other Ambulatory Visit: Payer: Self-pay

## 2021-04-16 DIAGNOSIS — E785 Hyperlipidemia, unspecified: Secondary | ICD-10-CM | POA: Insufficient documentation

## 2021-04-16 DIAGNOSIS — I10 Essential (primary) hypertension: Secondary | ICD-10-CM | POA: Diagnosis not present

## 2021-04-16 LAB — ECHOCARDIOGRAM COMPLETE
Area-P 1/2: 3.42 cm2
S' Lateral: 2.2 cm

## 2021-06-15 LAB — LIPID PANEL
Chol/HDL Ratio: 2.4 ratio (ref 0.0–4.4)
Cholesterol, Total: 198 mg/dL (ref 100–199)
HDL: 84 mg/dL (ref >39)
LDL Chol Calc (NIH): 97 mg/dL (ref 0–99)
Triglycerides: 94 mg/dL (ref 0–149)
VLDL Cholesterol Cal: 17 mg/dL (ref 5–40)

## 2021-06-15 LAB — HEPATIC FUNCTION PANEL
ALT: 24 IU/L (ref 0–32)
AST: 26 IU/L (ref 0–40)
Albumin: 5 g/dL — ABNORMAL HIGH (ref 3.8–4.8)
Alkaline Phosphatase: 48 IU/L (ref 44–121)
Bilirubin Total: 0.5 mg/dL (ref 0.0–1.2)
Bilirubin, Direct: 0.1 mg/dL (ref 0.00–0.40)
Total Protein: 7.2 g/dL (ref 6.0–8.5)

## 2021-06-20 ENCOUNTER — Ambulatory Visit: Payer: No Typology Code available for payment source | Admitting: General Practice

## 2021-06-27 ENCOUNTER — Other Ambulatory Visit: Payer: Self-pay

## 2021-06-27 ENCOUNTER — Ambulatory Visit: Payer: No Typology Code available for payment source | Admitting: Cardiovascular Disease

## 2021-06-27 ENCOUNTER — Ambulatory Visit (INDEPENDENT_AMBULATORY_CARE_PROVIDER_SITE_OTHER): Payer: No Typology Code available for payment source | Admitting: Cardiovascular Disease

## 2021-06-27 ENCOUNTER — Encounter: Payer: Self-pay | Admitting: Cardiovascular Disease

## 2021-06-27 VITALS — BP 132/68 | HR 66 | Ht 62.0 in | Wt 125.0 lb

## 2021-06-27 DIAGNOSIS — E782 Mixed hyperlipidemia: Secondary | ICD-10-CM

## 2021-06-27 NOTE — Patient Instructions (Signed)

## 2021-06-27 NOTE — Progress Notes (Signed)
Ms. Oviedo returns today for follow-up of her outpatient tests.  I did get a 2D echo which was essentially normal as well as a coronary calcium score was 0.  I changed her pravastatin to atorvastatin which resulted in a decrease in her total cholesterol from 274 down to 198 and a decrease in her LDL from 148 down to 97.  She is completely asymptomatic.  I will see her back as needed.  Runell Gess, M.D., FACP, [facility], [facility] [address] [place], [place]  [address]  [phone] 06/27/2021 3:43 PM

## 2022-03-04 ENCOUNTER — Other Ambulatory Visit: Payer: Self-pay | Admitting: Obstetrics and Gynecology

## 2022-03-04 DIAGNOSIS — R928 Other abnormal and inconclusive findings on diagnostic imaging of breast: Secondary | ICD-10-CM

## 2022-03-18 ENCOUNTER — Other Ambulatory Visit: Payer: Self-pay | Admitting: Cardiovascular Disease

## 2022-03-18 DIAGNOSIS — E785 Hyperlipidemia, unspecified: Secondary | ICD-10-CM

## 2022-03-19 ENCOUNTER — Ambulatory Visit
Admission: RE | Admit: 2022-03-19 | Discharge: 2022-03-19 | Disposition: A | Discharge status: Home / Self Care | Payer: No Typology Code available for payment source | Source: Ambulatory Visit | Attending: Obstetrics and Gynecology | Admitting: Obstetrics and Gynecology

## 2022-03-19 DIAGNOSIS — R928 Other abnormal and inconclusive findings on diagnostic imaging of breast: Secondary | ICD-10-CM

## 2022-12-11 ENCOUNTER — Other Ambulatory Visit: Payer: Self-pay | Admitting: Cardiovascular Disease

## 2022-12-11 DIAGNOSIS — E785 Hyperlipidemia, unspecified: Secondary | ICD-10-CM

## 2022-12-13 IMAGING — CT CT CARDIAC CORONARY ARTERY CALCIUM SCORE
3 series · 14 of 20 positions shown, 16 images · non-contrast
Comparison: None.
COMPARISON: None.

Addendum:
EXAM:
OVER-READ INTERPRETATION  CT CHEST

The following report is an over-read performed by radiologist Dr.
over-read does not include interpretation of cardiac or coronary
anatomy or pathology. The calcium score interpretation by the
cardiologist is attached.
CLINICAL DATA: Cardiovascular Disease Risk stratification
Coronary Calcium Score
TECHNIQUE: A gated, non-contrast computed tomography scan of the heart was
performed using 3mm slice thickness. Axial images were analyzed on a
dedicated workstation. Calcium scoring of the coronary arteries was
performed using the Agatston method.

[Series 2: cascseq 2.0 sa36 70% (id) · axial · 0.39mm/px · z∈[-247,-157]mm · 4 of 76 slices shown]
[im 16/76  vessel]
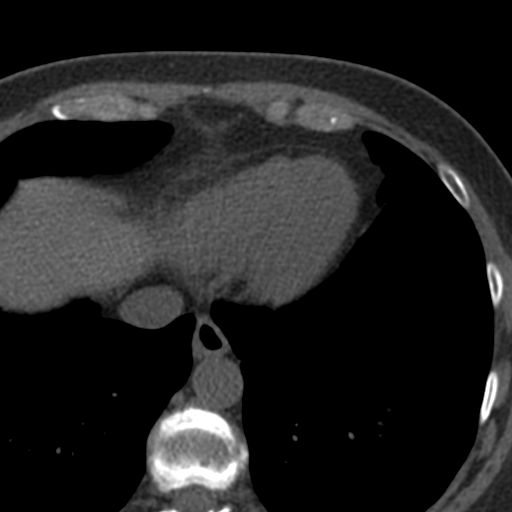
[im 31/76  vessel]
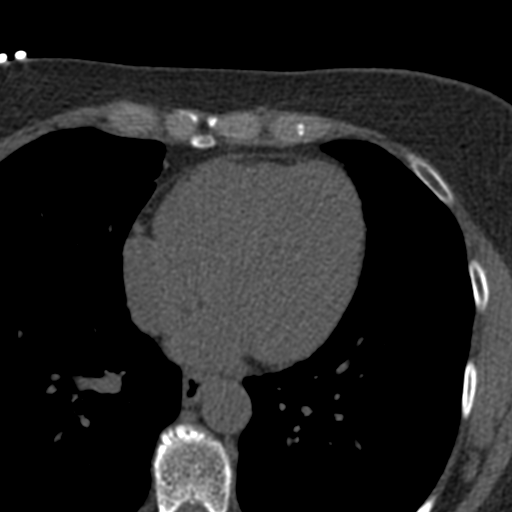
[im 46/76  vessel]
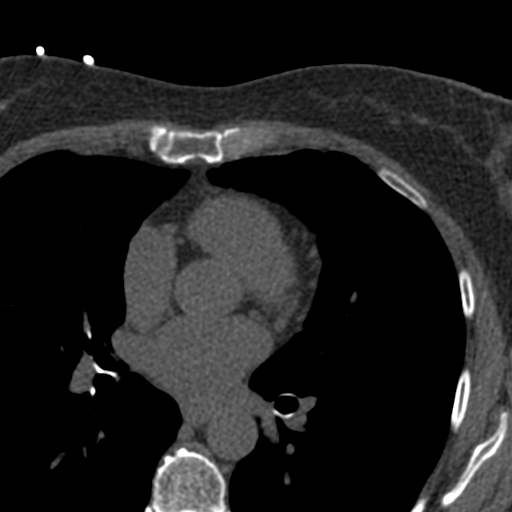
[im 61/76  vessel]
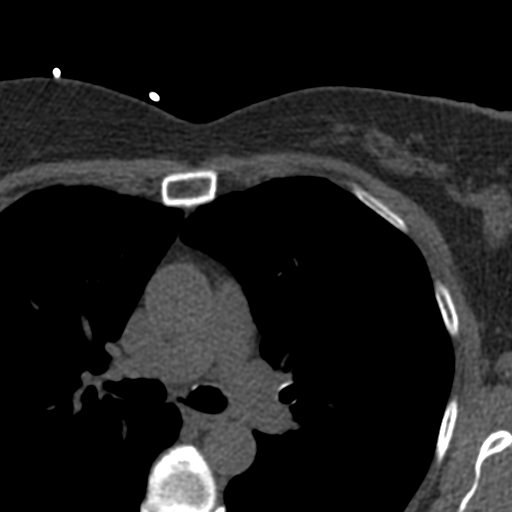

[Series 3: cascseq 2.0 bf37 st · axial · 0.67mm/px · z∈[-253,-153]mm · 5 of 76 slices shown, 7 images]
[im 13/76  vessel]
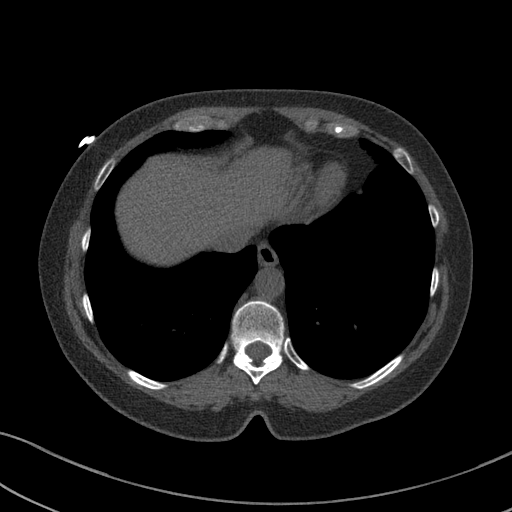
[im 13/76  lung]
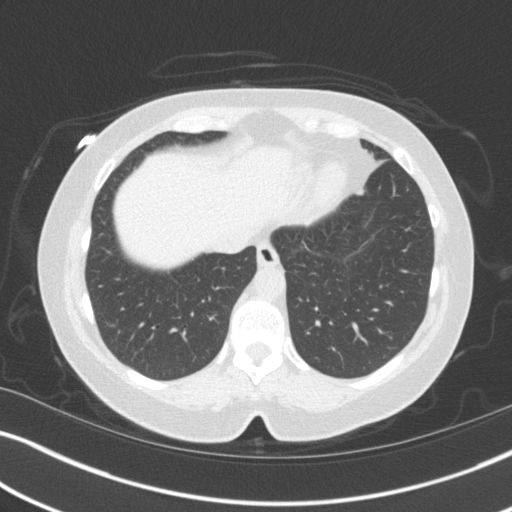
[im 26/76  vessel]
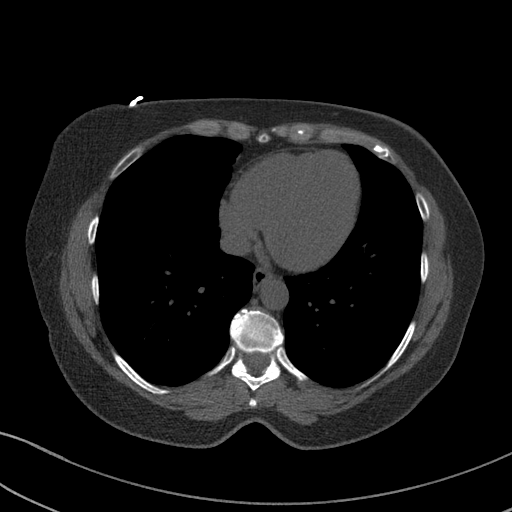
[im 38/76  vessel]
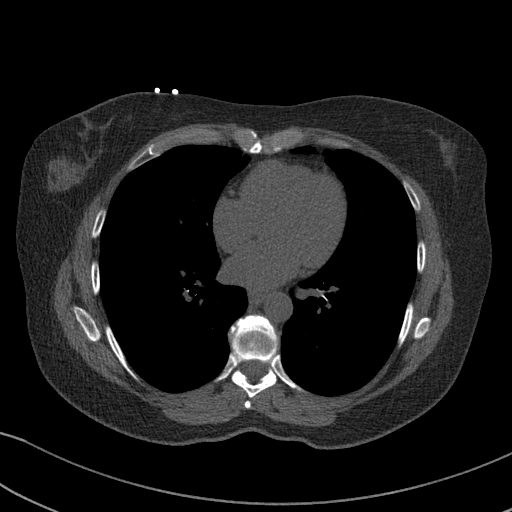
[im 51/76  vessel]
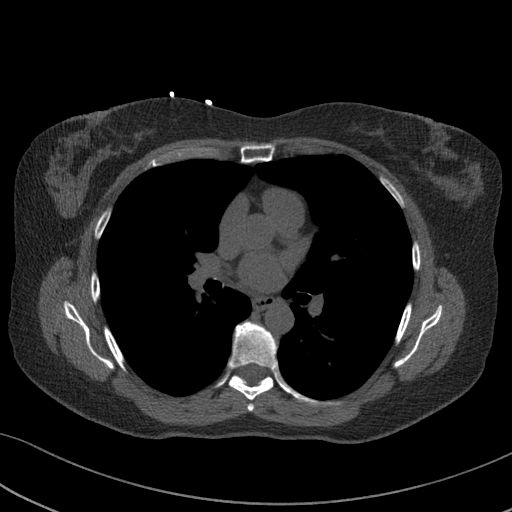
[im 63/76  vessel]
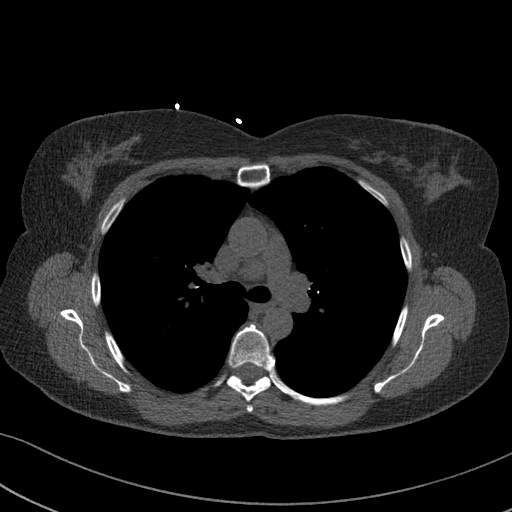
[im 63/76  lung]
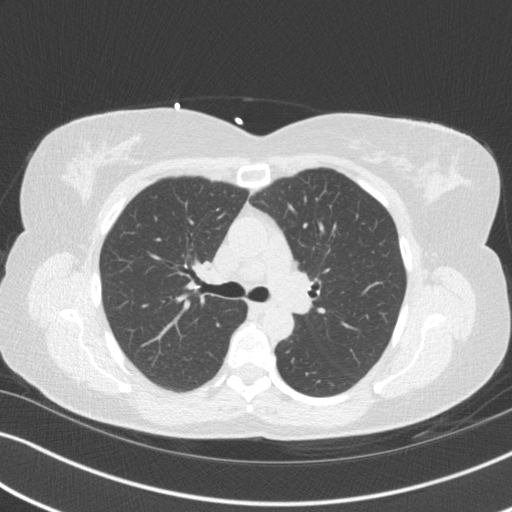

[Series 4: cascseq 2.0 br59 lung · axial · 0.67mm/px · z∈[-253,-153]mm · 5 of 76 slices shown]
[im 13/76  lung]
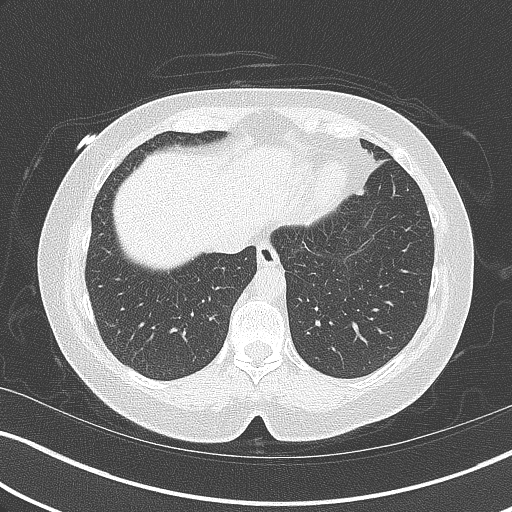
[im 26/76  lung]
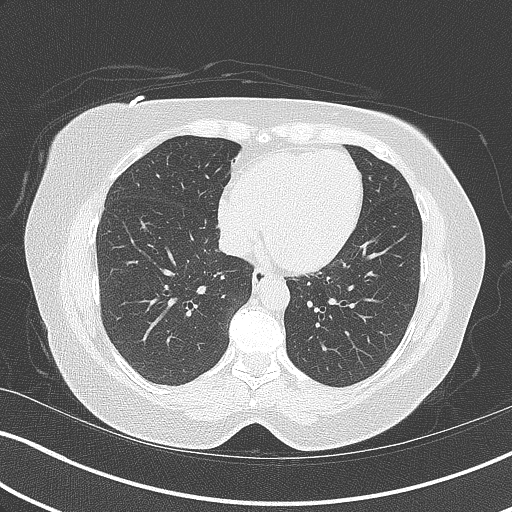
[im 38/76  lung]
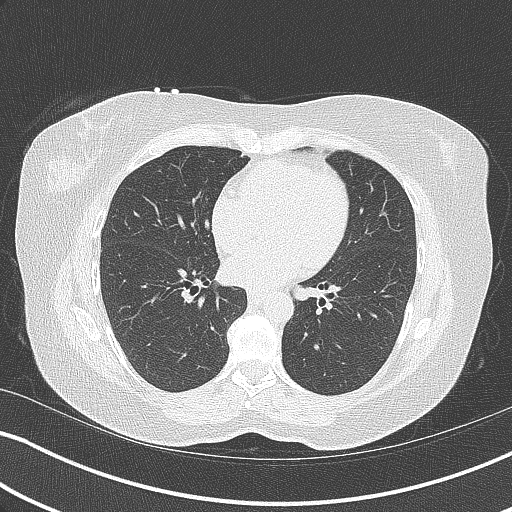
[im 51/76  lung]
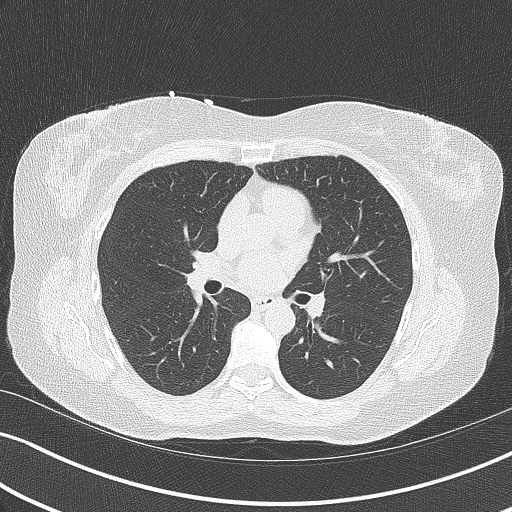
[im 63/76  lung]
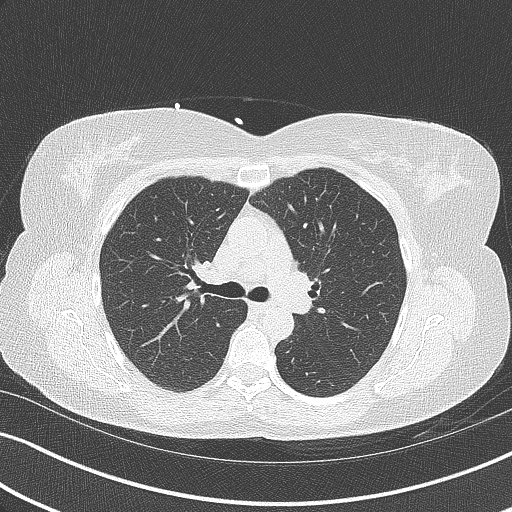

[14 of 20 positions shown; findings below may reference images not displayed]

FINDINGS: Vascular: Aortic atherosclerosis.

Mediastinum/Nodes: No imaged thoracic adenopathy.

Lungs/Pleura: No pleural fluid. Inferior right upper lobe calcified
granuloma. Clear left lung.

Upper Abdomen: Normal imaged portions of the liver, spleen, stomach.

Musculoskeletal: No acute osseous abnormality.
IMPRESSION: No acute findings in the imaged extracardiac chest.

Aortic Atherosclerosis (NRM5I-9VF.F).
FINDINGS: Coronary arteries: Normal origins.

Coronary Calcium Score:

Left main: 0

Left anterior descending artery: 0

Left circumflex artery: 0

Right coronary artery: 0

Total: 0

Percentile: 0

Pericardium: Normal.

Ascending Aorta: Normal caliber.

Non-cardiac: See separate report from [REDACTED].
IMPRESSION: Coronary calcium score of 0. This was 0 percentile for age-, race-,
and sex-matched controls.



If CAC=0, it is reasonable to withhold statin therapy and reassess
in 5 to 10 years, as long as higher risk conditions are absent
(diabetes mellitus, family history of premature CHD in first degree
relatives (males <55 years; females <65 years), cigarette smoking,
or LDL >=190 mg/dL).

If CAC is 1 to 99, it is reasonable to initiate statin therapy for
patients >=55 years of age.

If CAC is >=100 or >=75th percentile, it is reasonable to initiate
statin therapy at any age.

Cardiology referral should be considered for patients with CAC
scores >=400 or >=75th percentile.

*3636 AHA/ACC/AACVPR/AAPA/ABC/JIROUCH/PERE/FABRICE/Loza/ADAM/JUMPER/ABID
Guideline on the Management of Blood Cholesterol: A Report of the
American College of Cardiology/American Heart Association Task Force
on Clinical Practice Guidelines. J Am Coll Cardiol.
4504;73(24):9973-9749.

*** End of Addendum ***
EXAM:
OVER-READ INTERPRETATION  CT CHEST

The following report is an over-read performed by radiologist Dr.
over-read does not include interpretation of cardiac or coronary
anatomy or pathology. The calcium score interpretation by the
cardiologist is attached.
FINDINGS: Vascular: Aortic atherosclerosis.

Mediastinum/Nodes: No imaged thoracic adenopathy.

Lungs/Pleura: No pleural fluid. Inferior right upper lobe calcified
granuloma. Clear left lung.

Upper Abdomen: Normal imaged portions of the liver, spleen, stomach.

Musculoskeletal: No acute osseous abnormality.
IMPRESSION: No acute findings in the imaged extracardiac chest.

Aortic Atherosclerosis (NRM5I-9VF.F).

## 2023-09-09 ENCOUNTER — Other Ambulatory Visit: Payer: Self-pay | Admitting: Cardiovascular Disease

## 2023-09-09 DIAGNOSIS — E785 Hyperlipidemia, unspecified: Secondary | ICD-10-CM

## 2023-11-21 ENCOUNTER — Encounter: Payer: Self-pay | Admitting: Advanced Practice Midwife
